# Patient Record
Sex: Female | Born: 1975 | Race: Black or African American | Hispanic: No
Health system: Southern US, Community
[De-identification: ages and names within clinical notes are randomized; demographics above are authoritative.]

---

## 2014-08-08 ENCOUNTER — Other Ambulatory Visit: Payer: Self-pay

## 2014-08-08 DIAGNOSIS — Z1231 Encounter for screening mammogram for malignant neoplasm of breast: Secondary | ICD-10-CM

## 2016-12-08 ENCOUNTER — Other Ambulatory Visit: Payer: Self-pay | Admitting: Internal Medicine

## 2016-12-08 DIAGNOSIS — Z1231 Encounter for screening mammogram for malignant neoplasm of breast: Secondary | ICD-10-CM

## 2016-12-28 ENCOUNTER — Ambulatory Visit: Payer: Self-pay

## 2016-12-28 ENCOUNTER — Ambulatory Visit
Admission: RE | Admit: 2016-12-28 | Discharge: 2016-12-28 | Disposition: A | Discharge status: Home / Self Care | Payer: BC Managed Care – PPO | Source: Ambulatory Visit | Attending: Internal Medicine | Admitting: Internal Medicine

## 2016-12-28 DIAGNOSIS — Z1231 Encounter for screening mammogram for malignant neoplasm of breast: Secondary | ICD-10-CM

## 2016-12-30 ENCOUNTER — Other Ambulatory Visit: Payer: Self-pay | Admitting: Internal Medicine

## 2016-12-30 DIAGNOSIS — R928 Other abnormal and inconclusive findings on diagnostic imaging of breast: Secondary | ICD-10-CM

## 2017-01-06 ENCOUNTER — Ambulatory Visit
Admission: RE | Admit: 2017-01-06 | Discharge: 2017-01-06 | Disposition: A | Discharge status: Home / Self Care | Payer: BC Managed Care – PPO | Source: Ambulatory Visit | Attending: Internal Medicine | Admitting: Internal Medicine

## 2017-01-06 DIAGNOSIS — R928 Other abnormal and inconclusive findings on diagnostic imaging of breast: Secondary | ICD-10-CM

## 2017-01-20 ENCOUNTER — Ambulatory Visit: Payer: Self-pay

## 2018-03-15 ENCOUNTER — Other Ambulatory Visit: Payer: Self-pay | Admitting: Internal Medicine

## 2018-03-15 DIAGNOSIS — R928 Other abnormal and inconclusive findings on diagnostic imaging of breast: Secondary | ICD-10-CM

## 2018-03-19 IMAGING — MG 2D DIGITAL DIAGNOSTIC UNILATERAL RIGHT MAMMOGRAM WITH CAD AND AD
8 of 12 series · 8 of 28 positions shown · non-contrast
Comparison: Baseline screening mammogram dated 12/28/2016.

CLINICAL DATA: Patient returns today to evaluate a possible right
breast asymmetry identified on a recent baseline screening
mammogram.

EXAM:
2D DIGITAL DIAGNOSTIC RIGHT MAMMOGRAM WITH CAD AND ADJUNCT TOMO
ULTRASOUND RIGHT BREAST

[R ML synth-2D]
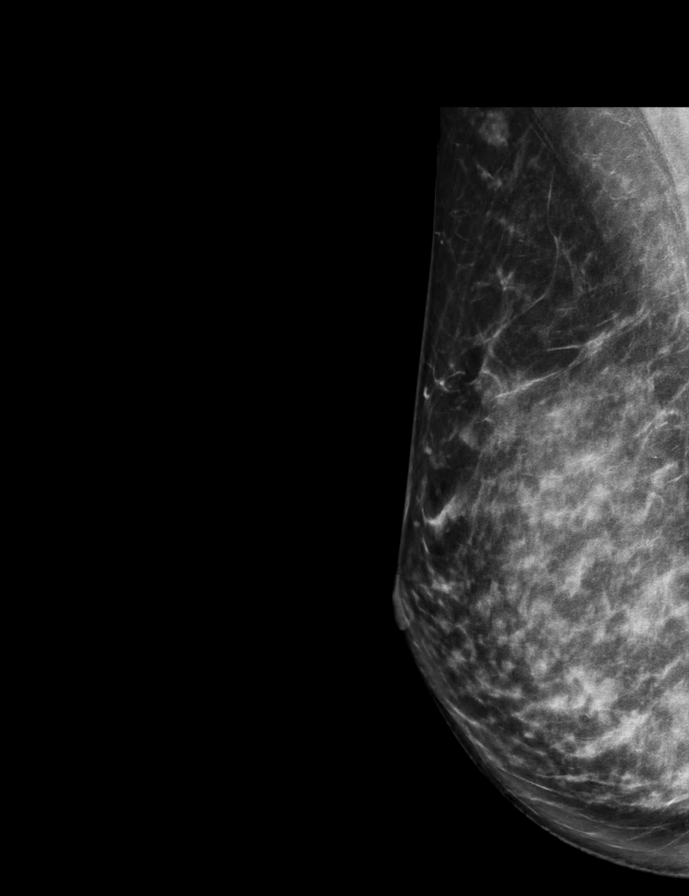

[R MLO (1 of 2)]
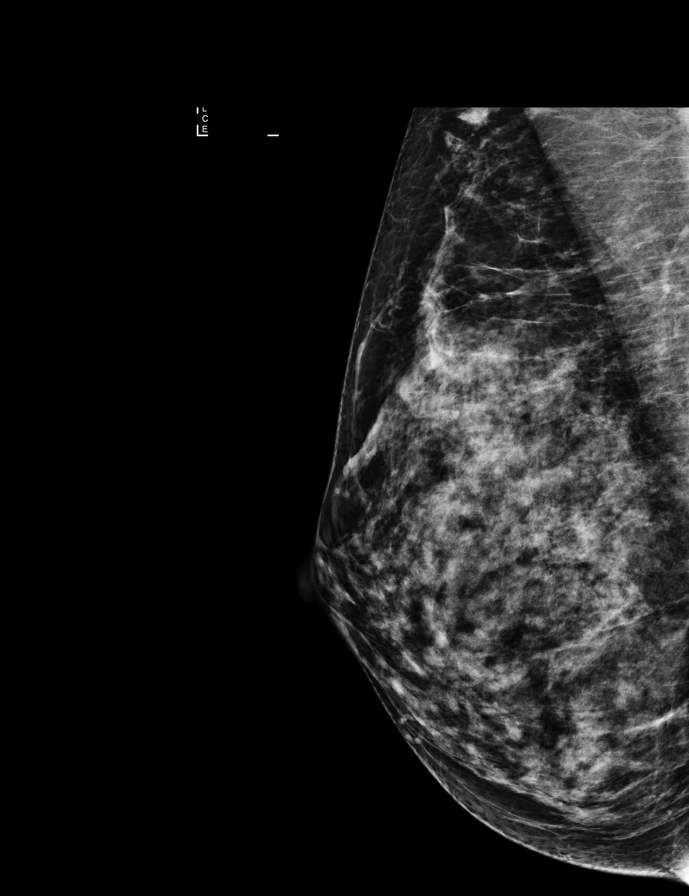

[R MLO synth-2D (1 of 2)]
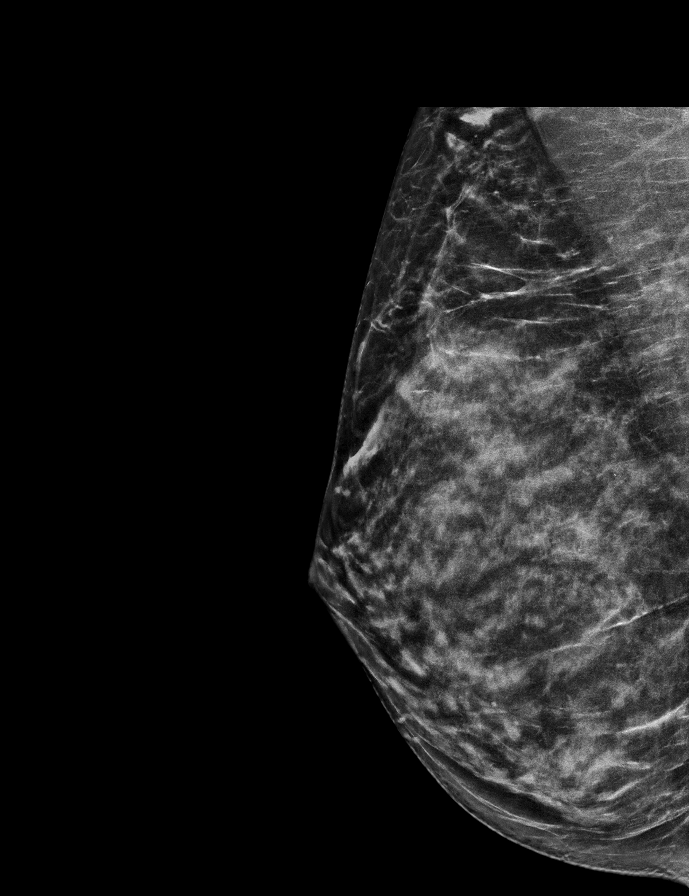

[R MLO (2 of 2)]
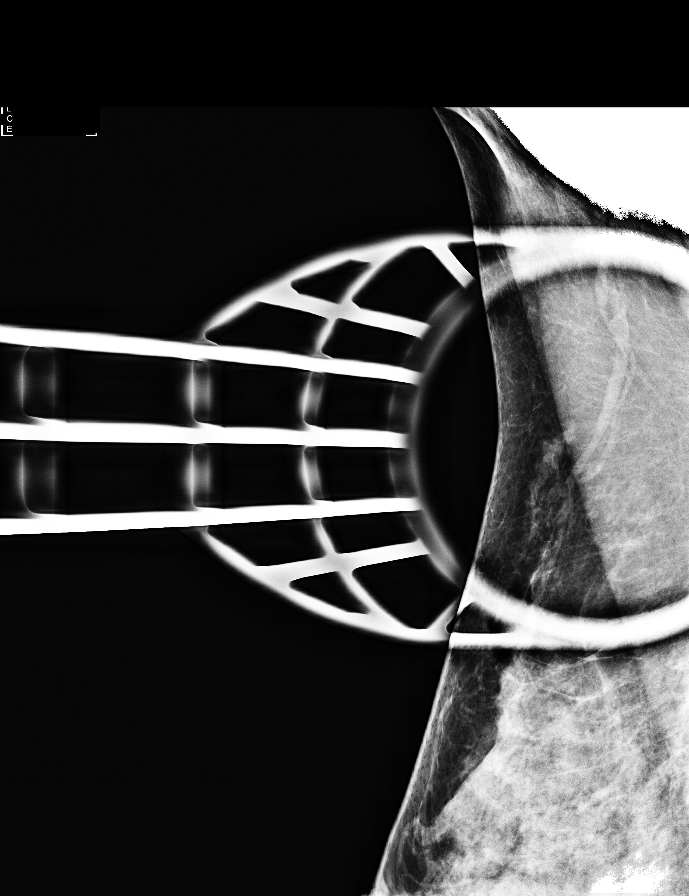

[R ML]
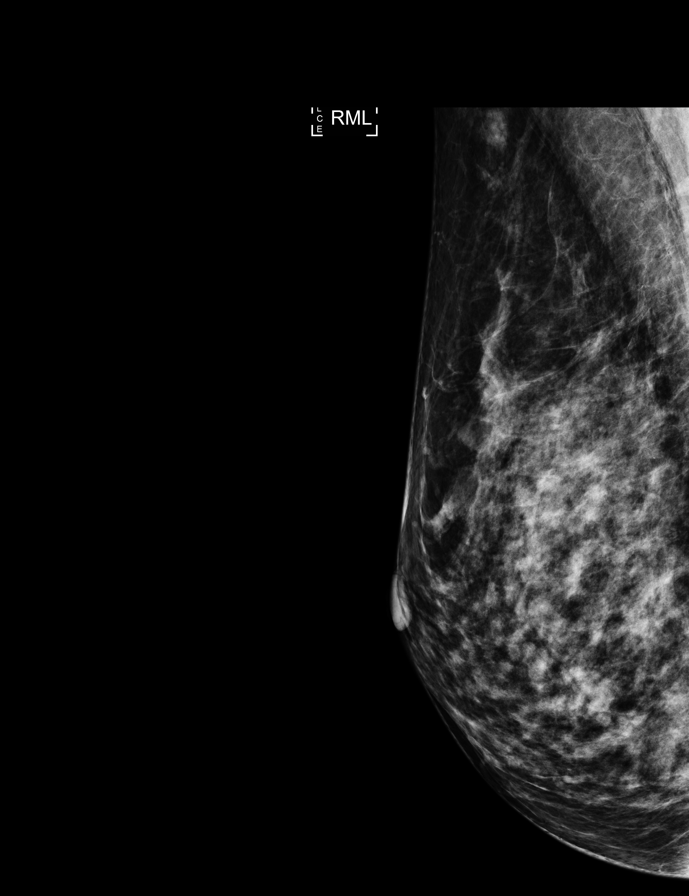

[R MLO synth-2D (2 of 2)]
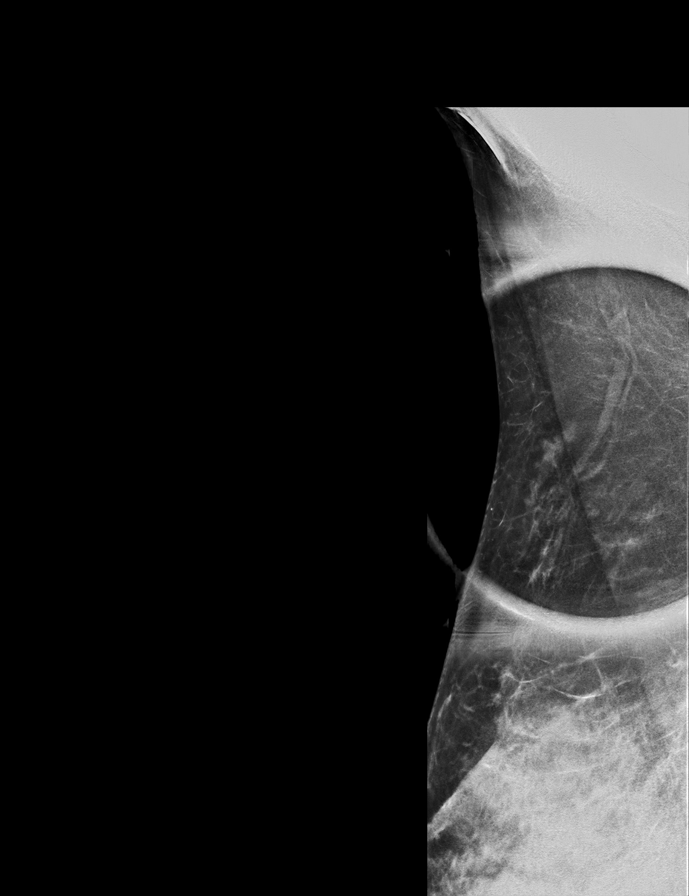

[R XCCL]
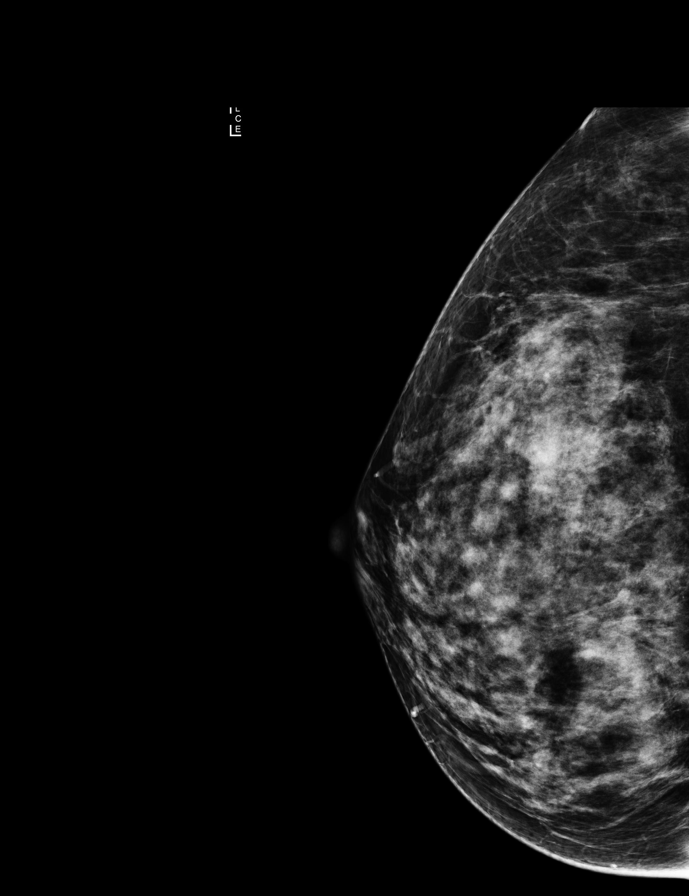

[R XCCL synth-2D]
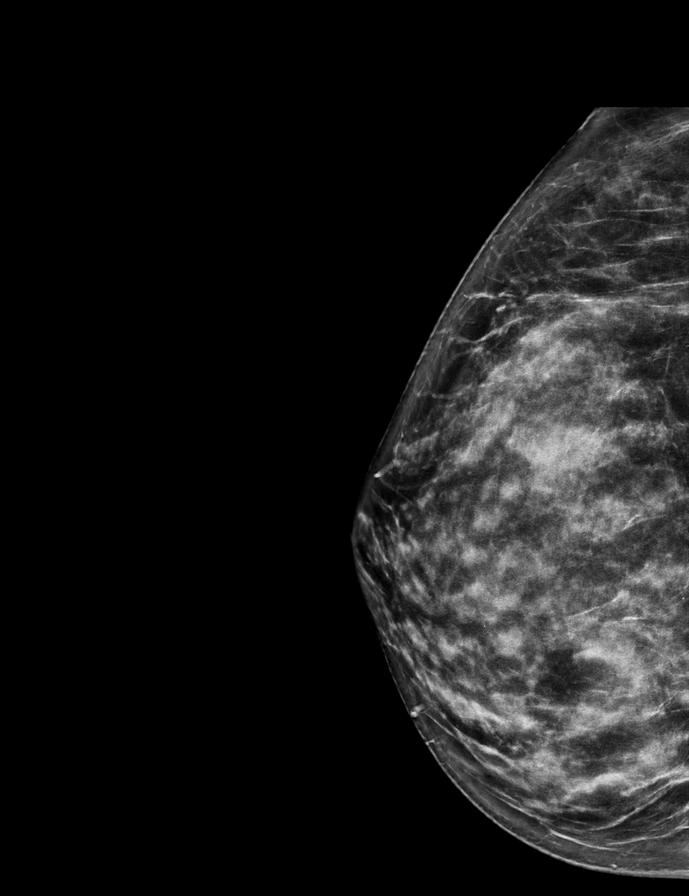

[8 of 28 positions shown; findings below may reference images not displayed]

ACR Breast Density Category c: The breast tissue is heterogeneously
dense, which may obscure small masses.
FINDINGS: On today's additional views of the right breast with spot
compression and 3D tomosynthesis, the questioned asymmetry within
the far superior right breast is most suggestive of normal
fibroglandular tissue (accessory breast tissue).

There are no dominant masses, suspicious calcifications or secondary
signs of malignancy identified in the right breast on today's exam.

Mammographic images were processed with CAD.

Targeted ultrasound is performed, evaluating the entire upper right
breast with particular attention to the [DATE] to [DATE] axis
corresponding to the area of mammographic finding, showing only
normal fibroglandular tissues and fat lobules. No suspicious solid
or cystic mass is identified.
IMPRESSION: Probably benign accessory breast tissue within the upper right
breast. As this was initially seen on patient's recent baseline
screening mammogram, follow-up right breast diagnostic mammogram is
recommended in 6 months to ensure stability.

RECOMMENDATION:
Right breast diagnostic mammogram in 6 months.

I have discussed the findings and recommendations with the patient.
Results were also provided in writing at the conclusion of the
visit. If applicable, a reminder letter will be sent to the patient
regarding the next appointment.

BI-RADS CATEGORY  3: Probably benign.

## 2018-03-22 ENCOUNTER — Ambulatory Visit
Admission: RE | Admit: 2018-03-22 | Discharge: 2018-03-22 | Disposition: A | Discharge status: Home / Self Care | Payer: BC Managed Care – PPO | Source: Ambulatory Visit | Attending: Internal Medicine | Admitting: Internal Medicine

## 2018-03-22 ENCOUNTER — Other Ambulatory Visit: Payer: Self-pay | Admitting: Internal Medicine

## 2018-03-22 DIAGNOSIS — R928 Other abnormal and inconclusive findings on diagnostic imaging of breast: Secondary | ICD-10-CM

## 2018-03-22 DIAGNOSIS — N6489 Other specified disorders of breast: Secondary | ICD-10-CM

## 2019-11-16 ENCOUNTER — Ambulatory Visit: Payer: BC Managed Care – PPO | Attending: Family

## 2019-11-16 DIAGNOSIS — Z23 Encounter for immunization: Secondary | ICD-10-CM | POA: Insufficient documentation

## 2019-11-16 NOTE — Progress Notes (Signed)
   Covid-19 Vaccination Clinic  Name:  Yvette Howell    MRN: [id number] DOB: 09/21/1976  11/16/2019  Yvette Howell was observed post Covid-19 immunization for 15 minutes without incidence. She was provided with Vaccine Information Sheet and instruction to access the V-Safe system.   Yvette Howell was instructed to call 911 with any severe reactions post vaccine: Marland Kitchen Difficulty breathing  . Swelling of your face and throat  . A fast heartbeat  . A bad rash all over your body  . Dizziness and weakness    Immunizations Administered    Name Date Dose VIS Date Route   Moderna COVID-19 Vaccine 11/16/2019  3:10 PM 0.5 mL 08/22/2019 Intramuscular   Manufacturer: Moderna   Lot: [id number]   NDC: 93903-009-23

## 2019-12-19 ENCOUNTER — Ambulatory Visit: Payer: BC Managed Care – PPO | Attending: Family

## 2019-12-19 DIAGNOSIS — Z23 Encounter for immunization: Secondary | ICD-10-CM

## 2019-12-19 NOTE — Progress Notes (Signed)
   Covid-19 Vaccination Clinic  Name:  Jemia Fata    MRN: [id number] DOB: 1976/06/28  12/19/2019  Ms. Haviland was observed post Covid-19 immunization for 15 minutes without incident. She was provided with Vaccine Information Sheet and instruction to access the V-Safe system.   Ms. Hizer was instructed to call 911 with any severe reactions post vaccine: Marland Kitchen Difficulty breathing  . Swelling of face and throat  . A fast heartbeat  . A bad rash all over body  . Dizziness and weakness   Immunizations Administered    Name Date Dose VIS Date Route   Moderna COVID-19 Vaccine 12/19/2019  9:50 AM 0.5 mL 08/22/2019 Intramuscular   Manufacturer: Moderna   Lot: [id number]   NDC: 01314-388-87

## 2020-04-16 ENCOUNTER — Other Ambulatory Visit: Payer: Self-pay | Admitting: Internal Medicine

## 2020-04-16 DIAGNOSIS — N6489 Other specified disorders of breast: Secondary | ICD-10-CM

## 2020-04-22 ENCOUNTER — Other Ambulatory Visit: Payer: Self-pay

## 2020-04-22 ENCOUNTER — Ambulatory Visit: Admission: RE | Admit: 2020-04-22 | Payer: BC Managed Care – PPO | Source: Ambulatory Visit

## 2020-04-22 ENCOUNTER — Ambulatory Visit
Admission: RE | Admit: 2020-04-22 | Discharge: 2020-04-22 | Disposition: A | Discharge status: Home / Self Care | Payer: BC Managed Care – PPO | Source: Ambulatory Visit | Attending: Internal Medicine | Admitting: Internal Medicine

## 2020-04-22 DIAGNOSIS — N6489 Other specified disorders of breast: Secondary | ICD-10-CM

## 2020-06-04 ENCOUNTER — Other Ambulatory Visit: Payer: Self-pay | Admitting: Critical Care Medicine

## 2020-06-04 ENCOUNTER — Other Ambulatory Visit: Payer: BC Managed Care – PPO

## 2020-06-04 DIAGNOSIS — Z20822 Contact with and (suspected) exposure to covid-19: Secondary | ICD-10-CM

## 2020-06-06 LAB — SARS-COV-2, NAA 2 DAY TAT

## 2020-06-06 LAB — NOVEL CORONAVIRUS, NAA: SARS-CoV-2, NAA: NOT DETECTED

## 2021-04-23 ENCOUNTER — Other Ambulatory Visit: Payer: Self-pay | Admitting: Internal Medicine

## 2021-04-23 DIAGNOSIS — Z1231 Encounter for screening mammogram for malignant neoplasm of breast: Secondary | ICD-10-CM

## 2021-04-24 ENCOUNTER — Other Ambulatory Visit: Payer: Self-pay

## 2021-04-24 ENCOUNTER — Ambulatory Visit
Admission: RE | Admit: 2021-04-24 | Discharge: 2021-04-24 | Disposition: A | Discharge status: Home / Self Care | Payer: BC Managed Care – PPO | Source: Ambulatory Visit | Attending: Internal Medicine | Admitting: Internal Medicine

## 2021-04-24 DIAGNOSIS — Z1231 Encounter for screening mammogram for malignant neoplasm of breast: Secondary | ICD-10-CM

## 2023-04-26 ENCOUNTER — Other Ambulatory Visit: Payer: Self-pay | Admitting: Internal Medicine

## 2023-04-26 DIAGNOSIS — Z1231 Encounter for screening mammogram for malignant neoplasm of breast: Secondary | ICD-10-CM

## 2023-05-05 ENCOUNTER — Ambulatory Visit
Admission: RE | Admit: 2023-05-05 | Discharge: 2023-05-05 | Disposition: A | Discharge status: Home / Self Care | Payer: BC Managed Care – PPO | Source: Ambulatory Visit | Attending: Internal Medicine | Admitting: Internal Medicine

## 2023-05-05 DIAGNOSIS — Z1231 Encounter for screening mammogram for malignant neoplasm of breast: Secondary | ICD-10-CM

## 2024-06-28 ENCOUNTER — Other Ambulatory Visit: Payer: Self-pay | Admitting: Internal Medicine

## 2024-06-28 DIAGNOSIS — Z1231 Encounter for screening mammogram for malignant neoplasm of breast: Secondary | ICD-10-CM

## 2024-06-30 ENCOUNTER — Ambulatory Visit
Admission: RE | Admit: 2024-06-30 | Discharge: 2024-06-30 | Disposition: A | Discharge status: Home / Self Care | Source: Ambulatory Visit | Attending: Internal Medicine | Admitting: Internal Medicine

## 2024-06-30 DIAGNOSIS — Z1231 Encounter for screening mammogram for malignant neoplasm of breast: Secondary | ICD-10-CM
